# Patient Record
Sex: Male | Born: 1985 | Race: Black or African American | Hispanic: No | Marital: Single | Smoking: Current every day smoker
Health system: Southern US, Community
[De-identification: ages and names within clinical notes are randomized; demographics above are authoritative.]

---

## 2016-08-19 ENCOUNTER — Emergency Department (HOSPITAL_COMMUNITY): Payer: Self-pay

## 2016-08-19 ENCOUNTER — Encounter (HOSPITAL_COMMUNITY): Payer: Self-pay | Admitting: Emergency Medicine

## 2016-08-19 ENCOUNTER — Emergency Department (HOSPITAL_COMMUNITY)
Admission: EM | Admit: 2016-08-19 | Discharge: 2016-08-19 | Disposition: A | Discharge status: Home / Self Care | Payer: Self-pay | Attending: Emergency Medicine | Admitting: Emergency Medicine

## 2016-08-19 DIAGNOSIS — S8392XA Sprain of unspecified site of left knee, initial encounter: Secondary | ICD-10-CM | POA: Insufficient documentation

## 2016-08-19 DIAGNOSIS — F17200 Nicotine dependence, unspecified, uncomplicated: Secondary | ICD-10-CM | POA: Insufficient documentation

## 2016-08-19 DIAGNOSIS — Y929 Unspecified place or not applicable: Secondary | ICD-10-CM | POA: Insufficient documentation

## 2016-08-19 DIAGNOSIS — X501XXA Overexertion from prolonged static or awkward postures, initial encounter: Secondary | ICD-10-CM | POA: Insufficient documentation

## 2016-08-19 DIAGNOSIS — Y990 Civilian activity done for income or pay: Secondary | ICD-10-CM | POA: Insufficient documentation

## 2016-08-19 DIAGNOSIS — Y9389 Activity, other specified: Secondary | ICD-10-CM | POA: Insufficient documentation

## 2016-08-19 MED ORDER — IBUPROFEN 200 MG PO TABS
400.0000 mg | ORAL_TABLET | Freq: Once | ORAL | Status: AC
  Administered 2016-08-19: 400 mg via ORAL
  Filled 2016-08-19: qty 2

## 2016-08-19 NOTE — ED Provider Notes (Signed)
[facility]-EMERGENCY DEPT Provider Note   CSN: [id number] Arrival date & time: 08/19/16  1251  By signing my name below, I, Emmanuella Mensah, attest that this documentation has been prepared under the direction and in the presence of [employer] Orenthal Debski, PA-C. Electronically Signed: Angelene GiovanniEmmanuella Mensah, ED Scribe. 08/19/16. 1:52 PM.   History   Chief Complaint Chief Complaint  Patient presents with  . Knee Pain    left    HPI Comments: Thomas Bryant is a 30 y.o. male who presents to the Emergency Department complaining of gradually worsening moderate left knee pain onset 2 months ago. He explains that he twisted his knee while ambulating at work as he heard a "pop". He denies any new injuries or trauma to the knee. No alleviating factors noted. Pt has not tried any medications PTA. He has NKDA. He denies any fever, vomiting, or any other symptoms.    The history is provided by the patient. No language interpreter was used.    History reviewed. No pertinent past medical history.  There are no active problems to display for this patient.   History reviewed. No pertinent surgical history.     Home Medications    Prior to Admission medications   Not on File    Family History No family history on file.  Social History Social History  Substance Use Topics  . Smoking status: Current Every Day Smoker  . Smokeless tobacco: Never Used  . Alcohol use No     Allergies   Review of patient's allergies indicates no known allergies.   Review of Systems Review of Systems  A complete 10 system review of systems was obtained and all systems are negative except as noted in the HPI and PMH.   Physical Exam Updated Vital Signs BP 132/81   Pulse 69   Temp 98.4 F (36.9 C)   Resp 16   Ht 6\' 2"  (1.88 m)   Wt 122.5 kg   SpO2 100%   BMI 34.67 kg/m   Physical Exam  Constitutional: He is oriented to person, place, and time. He appears well-developed and well-nourished.  HENT:    Head: Normocephalic and atraumatic.  Cardiovascular: Normal rate.   Pulmonary/Chest: Effort normal.  Musculoskeletal:  Left knee: No deformity, erythema or abrasions. FROM. Mild effusion, no crepitance. Anterior and posterior drawer show no abnormal laxity. Stable to valgus and varus stress. Joint lines are non-tender. Neurovascularly intact. Pt ambulates with non-antalgic gait.    Neurological: He is alert and oriented to person, place, and time.  Skin: Skin is warm and dry.  Psychiatric: He has a normal mood and affect.  Nursing note and vitals reviewed.     ED Treatments / Results  DIAGNOSTIC STUDIES: Oxygen Saturation is 100% on RA, normal by my interpretation.    COORDINATION OF CARE: 1:38 PM- Pt advised of plan for treatment and pt agrees.    Labs (all labs ordered are listed, but only abnormal results are displayed) Labs Reviewed - No data to display  EKG  EKG Interpretation None       Radiology Dg Knee 2 Views Left  Result Date: 08/19/2016 CLINICAL DATA:  Twisting injury of is leg at work 3 days ago. Persistent anterior pain radiating posteriorly especially with full extension. EXAM: LEFT KNEE - 1-2 VIEW COMPARISON:  None in PACs FINDINGS: The bones are subjectively adequately mineralized. There is no acute fracture nor dislocation. There is beaking of the tibial spines. The joint spaces are preserved. There is a small  suprapatellar effusion. IMPRESSION: There is no acute bony abnormality of the left knee. There is a small suprapatellar effusion. Electronically Signed   By: David  SwazilandJordan M.D.   On: 08/19/2016 13:30    Procedures Procedures (including critical care time)  Medications Ordered in ED Medications  ibuprofen (ADVIL,MOTRIN) tablet 400 mg (not administered)     Initial Impression / Assessment and Plan / ED Course  Wynetta EmeryNicole Abhishek Levesque, PA-C has reviewed the triage vital signs and the nursing notes.  Pertinent labs & imaging results that were available  during my care of the patient were reviewed by me and considered in my medical decision making (see chart for details).  Clinical Course    Vitals:   08/19/16 1301  BP: 132/81  Pulse: 69  Resp: 16  Temp: 98.4 F (36.9 C)  SpO2: 100%  Weight: 122.5 kg  Height: 6\' 2"  (1.88 m)    Medications  ibuprofen (ADVIL,MOTRIN) tablet 400 mg (not administered)    Thomas Bryant is 30 y.o. male presenting with left posterior knee pain. Neurovascularly intact with mild tenderness to palpation. X-rays negative. Will treat with rest, ice, compression elevation, NSAIDS. Patient given crutches and an orthopedic follow-up.   Evaluation does not show pathology that would require ongoing emergent intervention or inpatient treatment. Pt is hemodynamically stable and mentating appropriately. Discussed findings and plan with patient/guardian, who agrees with care plan. All questions answered. Return precautions discussed and outpatient follow up given.   Final Clinical Impressions(s) / ED Diagnoses   Final diagnoses:  Sprain of left knee, unspecified ligament, initial encounter    New Prescriptions New Prescriptions   No medications on file   I personally performed the services described in this documentation, which was scribed in my presence. The recorded information has been reviewed and is accurate. }    Wynetta Emeryicole Arelly Whittenberg, PA-C 08/19/16 1352    Benjiman CoreNathan Pickering, MD 08/19/16 1500

## 2016-08-19 NOTE — ED Triage Notes (Signed)
Pt reports was working when he heard "pop" left knee, denies fall nor injury . No redness nor edema noted upon assessment. Pt ambulated to triage room with steady gait.

## 2016-08-19 NOTE — Discharge Instructions (Signed)
Your Xrays today don't show any broken bones.  Rest, Ice intermittently (in the first 24-48 hours), Gentle compression with an Ace wrap, and elevate (Limb above the level of the heart)   Take up to 800mg  of ibuprofen (that is usually 4 over the counter pills)  3 times a day for 5 days. Take with food.  Do not hesitate to return to the emergency room for any new, worsening or concerning symptoms.  Please obtain primary care using resource guide below. Let them know that you were seen in the emergency room and that they will need to obtain records for further outpatient management.

## 2016-09-22 ENCOUNTER — Emergency Department (HOSPITAL_COMMUNITY)
Admission: EM | Admit: 2016-09-22 | Discharge: 2016-09-22 | Disposition: A | Discharge status: Home / Self Care | Payer: Self-pay | Attending: Emergency Medicine | Admitting: Emergency Medicine

## 2016-09-22 ENCOUNTER — Encounter (HOSPITAL_COMMUNITY): Payer: Self-pay | Admitting: Emergency Medicine

## 2016-09-22 ENCOUNTER — Emergency Department (HOSPITAL_COMMUNITY): Payer: Self-pay

## 2016-09-22 DIAGNOSIS — Y998 Other external cause status: Secondary | ICD-10-CM | POA: Insufficient documentation

## 2016-09-22 DIAGNOSIS — Y9367 Activity, basketball: Secondary | ICD-10-CM | POA: Insufficient documentation

## 2016-09-22 DIAGNOSIS — X501XXA Overexertion from prolonged static or awkward postures, initial encounter: Secondary | ICD-10-CM | POA: Insufficient documentation

## 2016-09-22 DIAGNOSIS — F17200 Nicotine dependence, unspecified, uncomplicated: Secondary | ICD-10-CM | POA: Insufficient documentation

## 2016-09-22 DIAGNOSIS — M25562 Pain in left knee: Secondary | ICD-10-CM | POA: Insufficient documentation

## 2016-09-22 DIAGNOSIS — Y929 Unspecified place or not applicable: Secondary | ICD-10-CM | POA: Insufficient documentation

## 2016-09-22 MED ORDER — NAPROXEN 500 MG PO TABS
500.0000 mg | ORAL_TABLET | Freq: Once | ORAL | Status: AC
  Administered 2016-09-22: 500 mg via ORAL
  Filled 2016-09-22: qty 1

## 2016-09-22 MED ORDER — NAPROXEN 500 MG PO TABS: 500.0000 mg | ORAL_TABLET | Freq: Two times a day (BID) | ORAL | 0 refills | Status: AC

## 2016-09-22 NOTE — ED Provider Notes (Signed)
[facility]-EMERGENCY DEPT Provider Note   CSN: [id number] Arrival date & time: 09/22/16  1534   History   Chief Complaint Chief Complaint  Patient presents with  . Knee Pain    HPI   Thomas Bryant is a 30 y.o. male with no significant PMH who presents to the ED for evaluation of left knee pain. He states he was playing basketball on 12/2 when he twisted his left knee. States he immediately felt pain throughout his anterior knee that radiates to his posterior left knee. He states he did not fall. States he has been able to ambulate though it is painful. Denies numbness or weakness. He has tried ice but no meds for his pain. He reports a similar injury a few months ago. States he never followed up with ortho. States he has crutches at home.  History reviewed. No pertinent past medical history.  There are no active problems to display for this patient.   History reviewed. No pertinent surgical history.     Home Medications    Prior to Admission medications   Not on File    Family History No family history on file.  Social History Social History  Substance Use Topics  . Smoking status: Current Every Day Smoker  . Smokeless tobacco: Never Used  . Alcohol use No     Allergies   Patient has no known allergies.   Review of Systems Review of Systems 10 Systems reviewed and are negative for acute change except as noted in the HPI.  Physical Exam Updated Vital Signs BP 161/91 (BP Location: Left Arm)   Pulse 61   Temp 98.3 F (36.8 C) (Oral)   Resp 16   SpO2 98%   Physical Exam  Constitutional: He is oriented to person, place, and time. No distress.  HENT:  Head: Atraumatic.  Right Ear: External ear normal.  Left Ear: External ear normal.  Nose: Nose normal.  Eyes: Conjunctivae are normal. No scleral icterus.  Cardiovascular: Normal rate and regular rhythm.   Pulmonary/Chest: Effort normal. No respiratory distress.  Abdominal: He exhibits no distension.    Musculoskeletal:  FROM of left knee with good strength. Mild suprapateller tenderness and tenderness to lateral and medial aspects. Pain with valgus and varus stress. Negative anterior drawer test. 2+ DP and PT Sensation intact throughout  Neurological: He is alert and oriented to person, place, and time.  Skin: Skin is warm and dry. He is not diaphoretic.  Psychiatric: He has a normal mood and affect. His behavior is normal.  Nursing note and vitals reviewed.    ED Treatments / Results  Labs (all labs ordered are listed, but only abnormal results are displayed) Labs Reviewed - No data to display  EKG  EKG Interpretation None       Radiology Dg Knee Complete 4 Views Left  Result Date: 09/22/2016 CLINICAL DATA:  Chronic progressive left knee pain for 2 months. EXAM: LEFT KNEE - COMPLETE 4+ VIEW COMPARISON:  08/19/2016 FINDINGS: No evidence of fracture, dislocation, or joint effusion. No evidence of arthropathy or other focal bone abnormality. Soft tissues are unremarkable. IMPRESSION: Normal exam. Electronically Signed   By: Francene BoyersJames  Maxwell M.D.   On: 09/22/2016 16:44    Procedures Procedures (including critical care time)  Medications Ordered in ED Medications  naproxen (NAPROSYN) tablet 500 mg (not administered)     Initial Impression / Assessment and Plan / ED Course  I have reviewed the triage vital signs and the nursing notes.  Pertinent  labs & imaging results that were available during my care of the patient were reviewed by me and considered in my medical decision making (see chart for details).  Clinical Course     X-ray negative. Will provide knee sleeve here. Encouraged RICE therapy. Pt states he has crutches at home. Re-iterated importance of ortho f/u. Pt otherwise neurovascularly intact. rx for naproxen given for pain. ER return precautions given.  Final Clinical Impressions(s) / ED Diagnoses   Final diagnoses:  Acute pain of left knee    New  Prescriptions New Prescriptions   NAPROXEN (NAPROSYN) 500 MG TABLET    Take 1 tablet (500 mg total) by mouth 2 (two) times daily.     Carlene CoriaSerena Y Gaylord Seydel, PA-C 09/22/16 1747    Jacalyn LefevreJulie Haviland, MD 09/22/16 2026

## 2016-09-22 NOTE — ED Triage Notes (Signed)
Per pt, states he twisted left knee this past Saturday-unable to bear a lot of weight, increased pain and swelling

## 2016-09-22 NOTE — Discharge Instructions (Signed)
Your x-ray was negative. We gave you a knee sleeve here to wear. Use the crutches you have at home as needed. Follow up with Dr. Eulah PontMurphy of orthopedic surgery. Return to the ER for new or worsening symptoms.

## 2017-09-11 IMAGING — CR DG KNEE 1-2V*L*
2 series · 2 of 2 positions shown · non-contrast
Comparison: None in PACs

CLINICAL DATA: Twisting injury of is leg at work 3 days ago.
Persistent anterior pain radiating posteriorly especially with full
extension.

EXAM:
LEFT KNEE - 1-2 VIEW

[t knee ap left]
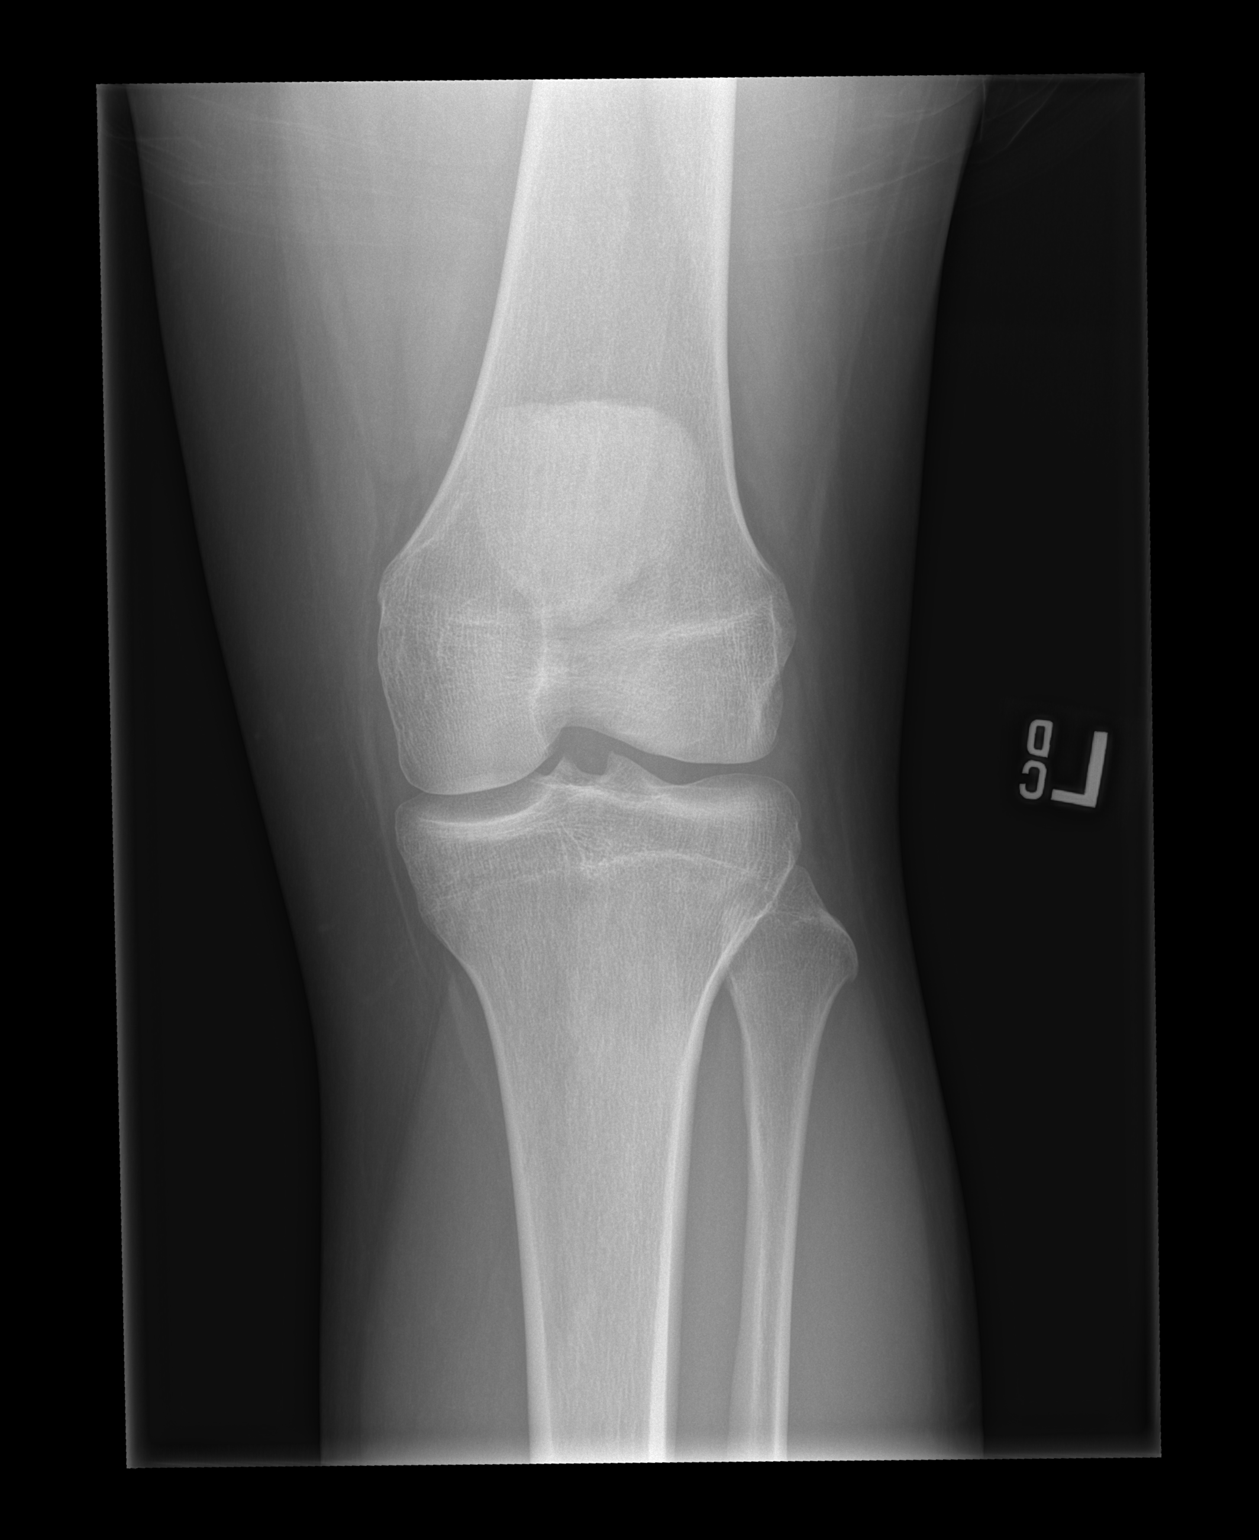

[t knee lat left]
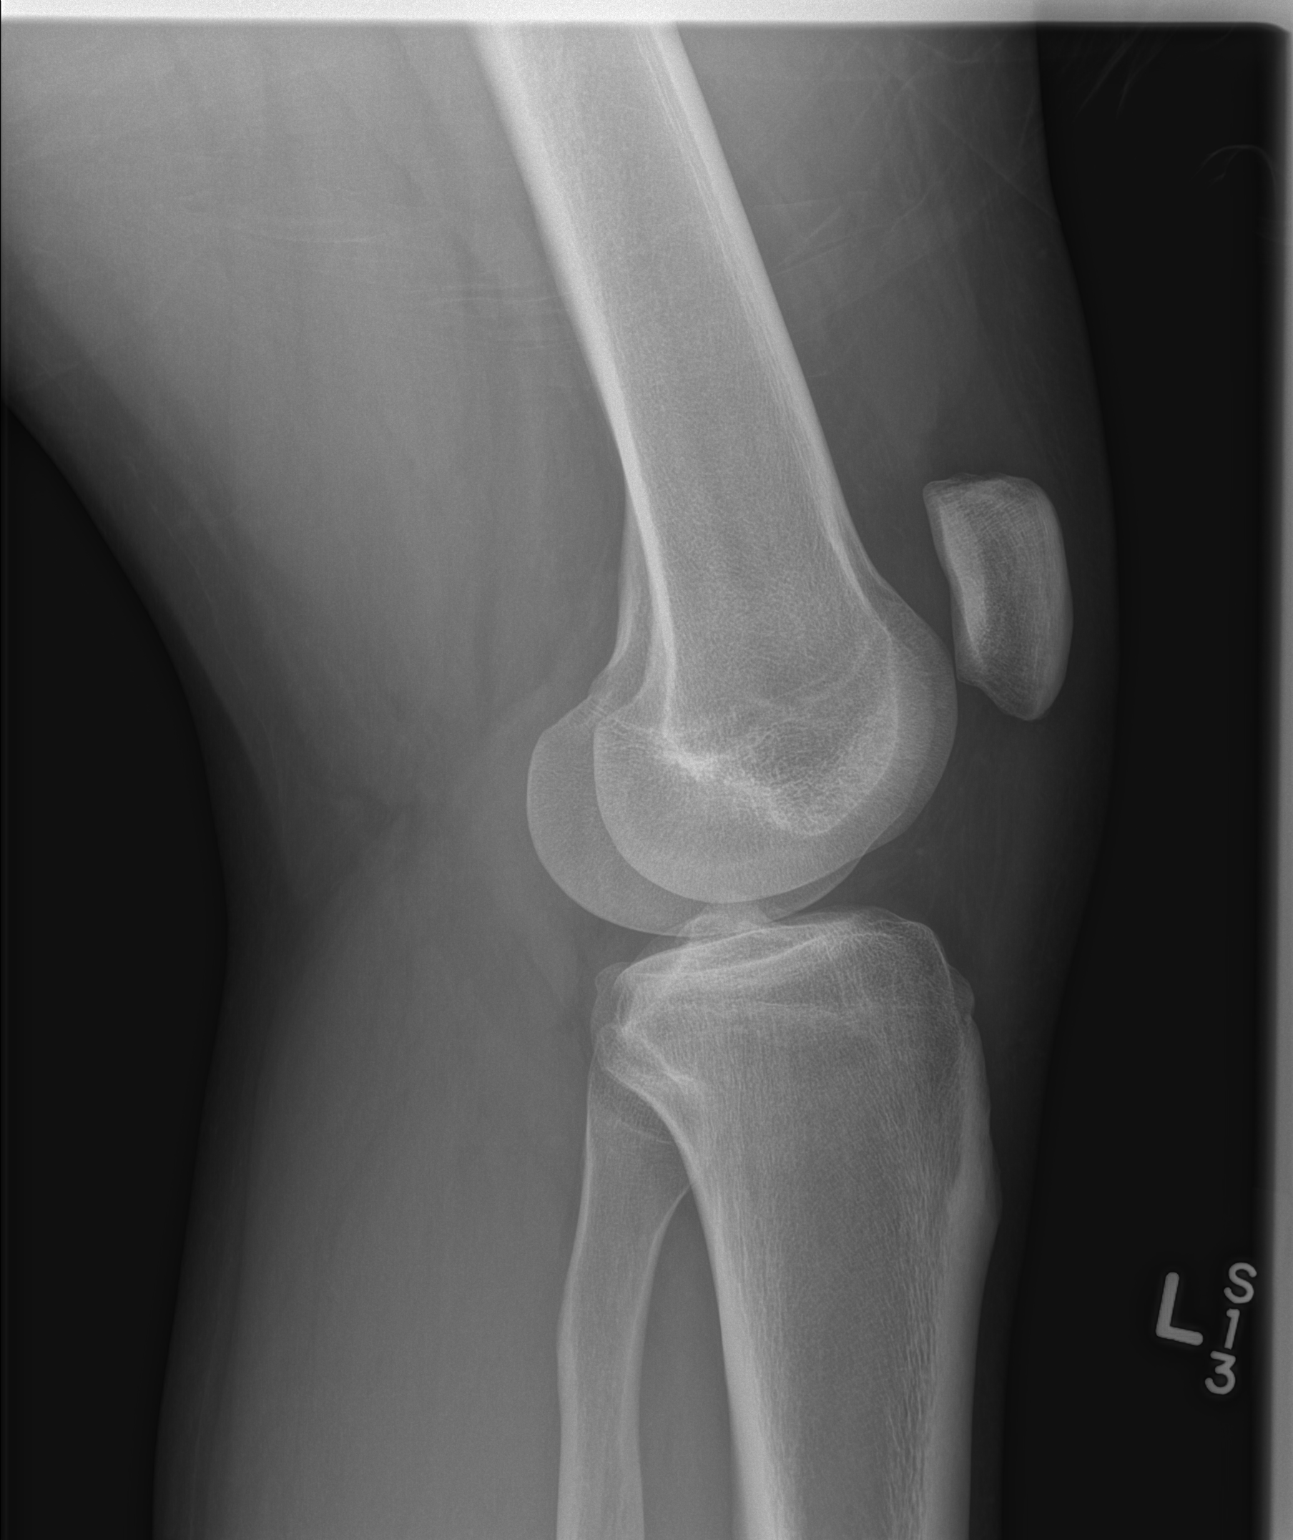

[2 of 2 positions shown; findings below may reference images not displayed]

FINDINGS: The bones are subjectively adequately mineralized. There is no acute
fracture nor dislocation. There is beaking of the tibial spines. The
joint spaces are preserved. There is a small suprapatellar effusion.
IMPRESSION: There is no acute bony abnormality of the left knee. There is a
small suprapatellar effusion.
